# Patient Record
Sex: Male | Born: 1945
Health system: Southern US, Community
[De-identification: ages and names within clinical notes are randomized; demographics above are authoritative.]

## PROBLEM LIST (undated history)

## (undated) DIAGNOSIS — R002 Palpitations: Secondary | ICD-10-CM

## (undated) HISTORY — DX: Palpitations: R00.2

---

## 2004-01-25 ENCOUNTER — Ambulatory Visit (HOSPITAL_COMMUNITY): Admission: RE | Admit: 2004-01-25 | Discharge: 2004-01-25 | Payer: Self-pay | Admitting: *Deleted

## 2015-08-14 DIAGNOSIS — R972 Elevated prostate specific antigen [PSA]: Secondary | ICD-10-CM | POA: Diagnosis not present

## 2015-11-12 DIAGNOSIS — R972 Elevated prostate specific antigen [PSA]: Secondary | ICD-10-CM | POA: Diagnosis not present

## 2015-12-04 DIAGNOSIS — L821 Other seborrheic keratosis: Secondary | ICD-10-CM | POA: Diagnosis not present

## 2015-12-04 DIAGNOSIS — D1801 Hemangioma of skin and subcutaneous tissue: Secondary | ICD-10-CM | POA: Diagnosis not present

## 2015-12-04 DIAGNOSIS — D230 Other benign neoplasm of skin of lip: Secondary | ICD-10-CM | POA: Diagnosis not present

## 2015-12-04 DIAGNOSIS — D044 Carcinoma in situ of skin of scalp and neck: Secondary | ICD-10-CM | POA: Diagnosis not present

## 2015-12-04 DIAGNOSIS — D485 Neoplasm of uncertain behavior of skin: Secondary | ICD-10-CM | POA: Diagnosis not present

## 2015-12-04 DIAGNOSIS — D0421 Carcinoma in situ of skin of right ear and external auricular canal: Secondary | ICD-10-CM | POA: Diagnosis not present

## 2015-12-04 DIAGNOSIS — Z85828 Personal history of other malignant neoplasm of skin: Secondary | ICD-10-CM | POA: Diagnosis not present

## 2015-12-04 DIAGNOSIS — L570 Actinic keratosis: Secondary | ICD-10-CM | POA: Diagnosis not present

## 2015-12-04 DIAGNOSIS — L438 Other lichen planus: Secondary | ICD-10-CM | POA: Diagnosis not present

## 2016-03-14 DIAGNOSIS — H43813 Vitreous degeneration, bilateral: Secondary | ICD-10-CM | POA: Diagnosis not present

## 2016-03-14 DIAGNOSIS — H52223 Regular astigmatism, bilateral: Secondary | ICD-10-CM | POA: Diagnosis not present

## 2016-03-14 DIAGNOSIS — H524 Presbyopia: Secondary | ICD-10-CM | POA: Diagnosis not present

## 2016-03-14 DIAGNOSIS — H25813 Combined forms of age-related cataract, bilateral: Secondary | ICD-10-CM | POA: Diagnosis not present

## 2016-03-14 DIAGNOSIS — H43393 Other vitreous opacities, bilateral: Secondary | ICD-10-CM | POA: Diagnosis not present

## 2016-03-14 DIAGNOSIS — H5203 Hypermetropia, bilateral: Secondary | ICD-10-CM | POA: Diagnosis not present

## 2016-06-04 DIAGNOSIS — L905 Scar conditions and fibrosis of skin: Secondary | ICD-10-CM | POA: Diagnosis not present

## 2016-06-04 DIAGNOSIS — D2272 Melanocytic nevi of left lower limb, including hip: Secondary | ICD-10-CM | POA: Diagnosis not present

## 2016-06-04 DIAGNOSIS — L821 Other seborrheic keratosis: Secondary | ICD-10-CM | POA: Diagnosis not present

## 2016-06-04 DIAGNOSIS — L988 Other specified disorders of the skin and subcutaneous tissue: Secondary | ICD-10-CM | POA: Diagnosis not present

## 2016-06-04 DIAGNOSIS — L820 Inflamed seborrheic keratosis: Secondary | ICD-10-CM | POA: Diagnosis not present

## 2016-06-04 DIAGNOSIS — D1801 Hemangioma of skin and subcutaneous tissue: Secondary | ICD-10-CM | POA: Diagnosis not present

## 2016-06-04 DIAGNOSIS — D485 Neoplasm of uncertain behavior of skin: Secondary | ICD-10-CM | POA: Diagnosis not present

## 2016-06-04 DIAGNOSIS — Z85828 Personal history of other malignant neoplasm of skin: Secondary | ICD-10-CM | POA: Diagnosis not present

## 2016-06-04 DIAGNOSIS — D2271 Melanocytic nevi of right lower limb, including hip: Secondary | ICD-10-CM | POA: Diagnosis not present

## 2016-06-04 DIAGNOSIS — L570 Actinic keratosis: Secondary | ICD-10-CM | POA: Diagnosis not present

## 2016-07-14 DIAGNOSIS — Z79899 Other long term (current) drug therapy: Secondary | ICD-10-CM | POA: Diagnosis not present

## 2016-07-14 DIAGNOSIS — Z0001 Encounter for general adult medical examination with abnormal findings: Secondary | ICD-10-CM | POA: Diagnosis not present

## 2016-07-14 DIAGNOSIS — E7800 Pure hypercholesterolemia, unspecified: Secondary | ICD-10-CM | POA: Diagnosis not present

## 2016-07-14 DIAGNOSIS — E559 Vitamin D deficiency, unspecified: Secondary | ICD-10-CM | POA: Diagnosis not present

## 2016-07-14 DIAGNOSIS — Z23 Encounter for immunization: Secondary | ICD-10-CM | POA: Diagnosis not present

## 2016-07-14 DIAGNOSIS — R972 Elevated prostate specific antigen [PSA]: Secondary | ICD-10-CM | POA: Diagnosis not present

## 2016-12-03 DIAGNOSIS — Z85828 Personal history of other malignant neoplasm of skin: Secondary | ICD-10-CM | POA: Diagnosis not present

## 2016-12-03 DIAGNOSIS — L570 Actinic keratosis: Secondary | ICD-10-CM | POA: Diagnosis not present

## 2016-12-03 DIAGNOSIS — D1801 Hemangioma of skin and subcutaneous tissue: Secondary | ICD-10-CM | POA: Diagnosis not present

## 2016-12-03 DIAGNOSIS — L821 Other seborrheic keratosis: Secondary | ICD-10-CM | POA: Diagnosis not present

## 2017-05-13 DIAGNOSIS — D1801 Hemangioma of skin and subcutaneous tissue: Secondary | ICD-10-CM | POA: Diagnosis not present

## 2017-05-13 DIAGNOSIS — L570 Actinic keratosis: Secondary | ICD-10-CM | POA: Diagnosis not present

## 2017-05-13 DIAGNOSIS — L438 Other lichen planus: Secondary | ICD-10-CM | POA: Diagnosis not present

## 2017-05-13 DIAGNOSIS — L905 Scar conditions and fibrosis of skin: Secondary | ICD-10-CM | POA: Diagnosis not present

## 2017-05-13 DIAGNOSIS — L565 Disseminated superficial actinic porokeratosis (DSAP): Secondary | ICD-10-CM | POA: Diagnosis not present

## 2017-05-13 DIAGNOSIS — Z85828 Personal history of other malignant neoplasm of skin: Secondary | ICD-10-CM | POA: Diagnosis not present

## 2017-05-13 DIAGNOSIS — H61001 Unspecified perichondritis of right external ear: Secondary | ICD-10-CM | POA: Diagnosis not present

## 2017-05-13 DIAGNOSIS — L821 Other seborrheic keratosis: Secondary | ICD-10-CM | POA: Diagnosis not present

## 2017-07-24 DIAGNOSIS — E7800 Pure hypercholesterolemia, unspecified: Secondary | ICD-10-CM | POA: Diagnosis not present

## 2017-07-24 DIAGNOSIS — Z125 Encounter for screening for malignant neoplasm of prostate: Secondary | ICD-10-CM | POA: Diagnosis not present

## 2017-07-24 DIAGNOSIS — Z79899 Other long term (current) drug therapy: Secondary | ICD-10-CM | POA: Diagnosis not present

## 2017-07-24 DIAGNOSIS — Z0000 Encounter for general adult medical examination without abnormal findings: Secondary | ICD-10-CM | POA: Diagnosis not present

## 2017-07-24 DIAGNOSIS — Z23 Encounter for immunization: Secondary | ICD-10-CM | POA: Diagnosis not present

## 2017-07-24 DIAGNOSIS — M545 Low back pain: Secondary | ICD-10-CM | POA: Diagnosis not present

## 2017-07-28 DIAGNOSIS — K648 Other hemorrhoids: Secondary | ICD-10-CM | POA: Diagnosis not present

## 2017-07-28 DIAGNOSIS — Z86010 Personal history of colonic polyps: Secondary | ICD-10-CM | POA: Diagnosis not present

## 2017-07-28 DIAGNOSIS — K5730 Diverticulosis of large intestine without perforation or abscess without bleeding: Secondary | ICD-10-CM | POA: Diagnosis not present

## 2017-07-28 DIAGNOSIS — K621 Rectal polyp: Secondary | ICD-10-CM | POA: Diagnosis not present

## 2017-07-28 DIAGNOSIS — K6289 Other specified diseases of anus and rectum: Secondary | ICD-10-CM | POA: Diagnosis not present

## 2017-07-29 ENCOUNTER — Other Ambulatory Visit: Payer: Self-pay | Admitting: Family Medicine

## 2017-07-29 DIAGNOSIS — M545 Low back pain, unspecified: Secondary | ICD-10-CM

## 2017-07-30 DIAGNOSIS — K621 Rectal polyp: Secondary | ICD-10-CM | POA: Diagnosis not present

## 2017-11-11 DIAGNOSIS — D1722 Benign lipomatous neoplasm of skin and subcutaneous tissue of left arm: Secondary | ICD-10-CM | POA: Diagnosis not present

## 2017-11-11 DIAGNOSIS — D2372 Other benign neoplasm of skin of left lower limb, including hip: Secondary | ICD-10-CM | POA: Diagnosis not present

## 2017-11-11 DIAGNOSIS — L570 Actinic keratosis: Secondary | ICD-10-CM | POA: Diagnosis not present

## 2017-11-11 DIAGNOSIS — L821 Other seborrheic keratosis: Secondary | ICD-10-CM | POA: Diagnosis not present

## 2017-11-11 DIAGNOSIS — L820 Inflamed seborrheic keratosis: Secondary | ICD-10-CM | POA: Diagnosis not present

## 2017-11-11 DIAGNOSIS — Z85828 Personal history of other malignant neoplasm of skin: Secondary | ICD-10-CM | POA: Diagnosis not present

## 2018-05-17 DIAGNOSIS — L570 Actinic keratosis: Secondary | ICD-10-CM | POA: Diagnosis not present

## 2018-05-17 DIAGNOSIS — D045 Carcinoma in situ of skin of trunk: Secondary | ICD-10-CM | POA: Diagnosis not present

## 2018-05-17 DIAGNOSIS — D485 Neoplasm of uncertain behavior of skin: Secondary | ICD-10-CM | POA: Diagnosis not present

## 2018-05-17 DIAGNOSIS — L821 Other seborrheic keratosis: Secondary | ICD-10-CM | POA: Diagnosis not present

## 2018-05-17 DIAGNOSIS — Z85828 Personal history of other malignant neoplasm of skin: Secondary | ICD-10-CM | POA: Diagnosis not present

## 2018-05-17 DIAGNOSIS — L820 Inflamed seborrheic keratosis: Secondary | ICD-10-CM | POA: Diagnosis not present

## 2018-05-17 DIAGNOSIS — D1801 Hemangioma of skin and subcutaneous tissue: Secondary | ICD-10-CM | POA: Diagnosis not present

## 2018-05-17 DIAGNOSIS — D0472 Carcinoma in situ of skin of left lower limb, including hip: Secondary | ICD-10-CM | POA: Diagnosis not present

## 2018-07-30 DIAGNOSIS — E7800 Pure hypercholesterolemia, unspecified: Secondary | ICD-10-CM | POA: Diagnosis not present

## 2018-07-30 DIAGNOSIS — Z23 Encounter for immunization: Secondary | ICD-10-CM | POA: Diagnosis not present

## 2018-07-30 DIAGNOSIS — Z79899 Other long term (current) drug therapy: Secondary | ICD-10-CM | POA: Diagnosis not present

## 2018-07-30 DIAGNOSIS — Z0001 Encounter for general adult medical examination with abnormal findings: Secondary | ICD-10-CM | POA: Diagnosis not present

## 2018-07-30 DIAGNOSIS — Z125 Encounter for screening for malignant neoplasm of prostate: Secondary | ICD-10-CM | POA: Diagnosis not present

## 2019-03-15 DIAGNOSIS — M791 Myalgia, unspecified site: Secondary | ICD-10-CM | POA: Diagnosis not present

## 2019-03-15 DIAGNOSIS — M25519 Pain in unspecified shoulder: Secondary | ICD-10-CM | POA: Diagnosis not present

## 2019-03-15 DIAGNOSIS — R5383 Other fatigue: Secondary | ICD-10-CM | POA: Diagnosis not present

## 2019-03-17 DIAGNOSIS — M25519 Pain in unspecified shoulder: Secondary | ICD-10-CM | POA: Diagnosis not present

## 2019-03-17 DIAGNOSIS — R5383 Other fatigue: Secondary | ICD-10-CM | POA: Diagnosis not present

## 2019-03-30 DIAGNOSIS — R1013 Epigastric pain: Secondary | ICD-10-CM | POA: Diagnosis not present

## 2019-03-30 DIAGNOSIS — K219 Gastro-esophageal reflux disease without esophagitis: Secondary | ICD-10-CM | POA: Diagnosis not present

## 2019-03-30 DIAGNOSIS — R142 Eructation: Secondary | ICD-10-CM | POA: Diagnosis not present

## 2019-05-17 DIAGNOSIS — Z23 Encounter for immunization: Secondary | ICD-10-CM | POA: Diagnosis not present

## 2019-05-19 DIAGNOSIS — Z85828 Personal history of other malignant neoplasm of skin: Secondary | ICD-10-CM | POA: Diagnosis not present

## 2019-05-19 DIAGNOSIS — L565 Disseminated superficial actinic porokeratosis (DSAP): Secondary | ICD-10-CM | POA: Diagnosis not present

## 2019-05-19 DIAGNOSIS — L820 Inflamed seborrheic keratosis: Secondary | ICD-10-CM | POA: Diagnosis not present

## 2019-05-19 DIAGNOSIS — D1801 Hemangioma of skin and subcutaneous tissue: Secondary | ICD-10-CM | POA: Diagnosis not present

## 2019-05-19 DIAGNOSIS — L821 Other seborrheic keratosis: Secondary | ICD-10-CM | POA: Diagnosis not present

## 2019-05-19 DIAGNOSIS — L570 Actinic keratosis: Secondary | ICD-10-CM | POA: Diagnosis not present

## 2019-08-23 DIAGNOSIS — Z0001 Encounter for general adult medical examination with abnormal findings: Secondary | ICD-10-CM | POA: Diagnosis not present

## 2019-08-23 DIAGNOSIS — Z79899 Other long term (current) drug therapy: Secondary | ICD-10-CM | POA: Diagnosis not present

## 2019-08-23 DIAGNOSIS — K219 Gastro-esophageal reflux disease without esophagitis: Secondary | ICD-10-CM | POA: Diagnosis not present

## 2019-08-23 DIAGNOSIS — E7800 Pure hypercholesterolemia, unspecified: Secondary | ICD-10-CM | POA: Diagnosis not present

## 2019-09-24 DIAGNOSIS — Z23 Encounter for immunization: Secondary | ICD-10-CM | POA: Diagnosis not present

## 2019-09-28 DIAGNOSIS — Z125 Encounter for screening for malignant neoplasm of prostate: Secondary | ICD-10-CM | POA: Diagnosis not present

## 2019-09-28 DIAGNOSIS — Z79899 Other long term (current) drug therapy: Secondary | ICD-10-CM | POA: Diagnosis not present

## 2019-09-28 DIAGNOSIS — E7800 Pure hypercholesterolemia, unspecified: Secondary | ICD-10-CM | POA: Diagnosis not present

## 2019-09-28 DIAGNOSIS — Z0001 Encounter for general adult medical examination with abnormal findings: Secondary | ICD-10-CM | POA: Diagnosis not present

## 2019-10-22 DIAGNOSIS — Z23 Encounter for immunization: Secondary | ICD-10-CM | POA: Diagnosis not present

## 2019-10-27 DIAGNOSIS — R7309 Other abnormal glucose: Secondary | ICD-10-CM | POA: Diagnosis not present

## 2019-11-15 DIAGNOSIS — L570 Actinic keratosis: Secondary | ICD-10-CM | POA: Diagnosis not present

## 2019-11-15 DIAGNOSIS — L814 Other melanin hyperpigmentation: Secondary | ICD-10-CM | POA: Diagnosis not present

## 2019-11-15 DIAGNOSIS — L821 Other seborrheic keratosis: Secondary | ICD-10-CM | POA: Diagnosis not present

## 2019-11-15 DIAGNOSIS — Z85828 Personal history of other malignant neoplasm of skin: Secondary | ICD-10-CM | POA: Diagnosis not present

## 2019-11-15 DIAGNOSIS — D1801 Hemangioma of skin and subcutaneous tissue: Secondary | ICD-10-CM | POA: Diagnosis not present

## 2020-03-15 DIAGNOSIS — Z20822 Contact with and (suspected) exposure to covid-19: Secondary | ICD-10-CM | POA: Diagnosis not present

## 2020-03-18 DIAGNOSIS — J309 Allergic rhinitis, unspecified: Secondary | ICD-10-CM | POA: Diagnosis not present

## 2020-03-18 DIAGNOSIS — J029 Acute pharyngitis, unspecified: Secondary | ICD-10-CM | POA: Diagnosis not present

## 2020-05-18 DIAGNOSIS — Z23 Encounter for immunization: Secondary | ICD-10-CM | POA: Diagnosis not present

## 2020-05-21 DIAGNOSIS — L700 Acne vulgaris: Secondary | ICD-10-CM | POA: Diagnosis not present

## 2020-05-21 DIAGNOSIS — L814 Other melanin hyperpigmentation: Secondary | ICD-10-CM | POA: Diagnosis not present

## 2020-05-21 DIAGNOSIS — L821 Other seborrheic keratosis: Secondary | ICD-10-CM | POA: Diagnosis not present

## 2020-05-21 DIAGNOSIS — Z85828 Personal history of other malignant neoplasm of skin: Secondary | ICD-10-CM | POA: Diagnosis not present

## 2020-05-21 DIAGNOSIS — D1801 Hemangioma of skin and subcutaneous tissue: Secondary | ICD-10-CM | POA: Diagnosis not present

## 2020-05-21 DIAGNOSIS — L570 Actinic keratosis: Secondary | ICD-10-CM | POA: Diagnosis not present

## 2020-05-23 ENCOUNTER — Other Ambulatory Visit: Payer: Self-pay

## 2020-05-23 ENCOUNTER — Encounter (HOSPITAL_COMMUNITY): Payer: Self-pay | Admitting: Emergency Medicine

## 2020-05-23 ENCOUNTER — Ambulatory Visit (HOSPITAL_COMMUNITY)
Admission: EM | Admit: 2020-05-23 | Discharge: 2020-05-23 | Disposition: A | Discharge status: Home / Self Care | Payer: Medicare Other | Attending: Family Medicine | Admitting: Family Medicine

## 2020-05-23 DIAGNOSIS — R531 Weakness: Secondary | ICD-10-CM | POA: Diagnosis not present

## 2020-05-23 DIAGNOSIS — R0602 Shortness of breath: Secondary | ICD-10-CM | POA: Insufficient documentation

## 2020-05-23 DIAGNOSIS — M549 Dorsalgia, unspecified: Secondary | ICD-10-CM | POA: Diagnosis not present

## 2020-05-23 DIAGNOSIS — R002 Palpitations: Secondary | ICD-10-CM | POA: Diagnosis not present

## 2020-05-23 LAB — CBC
HCT: 46 % (ref 39.0–52.0)
Hemoglobin: 15.2 g/dL (ref 13.0–17.0)
MCH: 28.7 pg (ref 26.0–34.0)
MCHC: 33 g/dL (ref 30.0–36.0)
MCV: 86.8 fL (ref 80.0–100.0)
Platelets: 220 10*3/uL (ref 150–400)
RBC: 5.3 MIL/uL (ref 4.22–5.81)
RDW: 12.9 % (ref 11.5–15.5)
WBC: 6.4 10*3/uL (ref 4.0–10.5)
nRBC: 0 % (ref 0.0–0.2)

## 2020-05-23 LAB — TSH: TSH: 1.08 u[IU]/mL (ref 0.350–4.500)

## 2020-05-23 LAB — BASIC METABOLIC PANEL
Anion gap: 11 (ref 5–15)
BUN: 14 mg/dL (ref 8–23)
CO2: 22 mmol/L (ref 22–32)
Calcium: 9.7 mg/dL (ref 8.9–10.3)
Chloride: 104 mmol/L (ref 98–111)
Creatinine, Ser: 1.24 mg/dL (ref 0.61–1.24)
GFR, Estimated: 58 mL/min — ABNORMAL LOW (ref >60)
Glucose, Bld: 141 mg/dL — ABNORMAL HIGH (ref 70–99)
Potassium: 3.6 mmol/L (ref 3.5–5.1)
Sodium: 137 mmol/L (ref 135–145)

## 2020-05-23 NOTE — ED Triage Notes (Signed)
Pt c/o rapid heart beat, weakness in legs, mid back pain, and shortness of breath onset yesterday. Pt states that the rapid heart beat is intermittent. He denies hx of asthma or pneumonia. He denies any cardiac hx.

## 2020-05-23 NOTE — Discharge Instructions (Signed)
You have been seen at the [facility] today for palpitations and brief shortness of breath Your evaluation today was not suggestive of any emergent condition requiring medical intervention at this time. Your ECG (heart tracing) did not show any signs of a heart attack. However, some medical problems make take more time to appear. Therefore, it's very important that you pay attention to any new symptoms or worsening of your current condition.  Please proceed directly to the Emergency Department immediately should you feel worse in any way or have any of the following symptoms: increasing or different symptoms, pain that spreads to your arm, neck, jaw, back or abdomen, or nausea and vomiting.

## 2020-05-24 NOTE — ED Provider Notes (Signed)
[place]   [id number] 05/23/20 Arrival Time: 1640  ASSESSMENT & PLAN:  1. Palpitations   2. Shortness of breath     Plans f/u with PCP. May need monitor to wear should symptoms continue. Labs pending.  ECG: Performed today and interpreted by me: sinus rhythm with occasional premature atrial contraction.  Very low susp for PE. Discussed.    Discharge Instructions     You have been seen at the [facility] today for palpitations and brief shortness of breath Your evaluation today was not suggestive of any emergent condition requiring medical intervention at this time. Your ECG (heart tracing) did not show any signs of a heart attack. However, some medical problems make take more time to appear. Therefore, it's very important that you pay attention to any new symptoms or worsening of your current condition.  Please proceed directly to the Emergency Department immediately should you feel worse in any way or have any of the following symptoms: increasing or different symptoms, pain that spreads to your arm, neck, jaw, back or abdomen, or nausea and vomiting.      Reviewed expectations re: course of current medical issues. Questions answered. Outlined signs and symptoms indicating need for more acute intervention. Patient verbalized understanding. After Visit Summary given.   SUBJECTIVE:  History from: patient. Johnny Choi is a 74 y.o. male who presents with complaint of "feeling my heart beat fast". Noted today. Intermittent. Without associated CP//n/v. Questions mild SOB while feeling hear race. No recent illnesses. Symptoms are sporadic without pattern. No new medications. No recent travel/immobility. Illicit drug use: none.  Social History   Tobacco Use  Smoking Status Never Smoker  Smokeless Tobacco Never Used   Social History   Substance and Sexual Activity  Alcohol Use Yes    Increased blood pressure noted today. Reports that he is  not treated for HTN.    OBJECTIVE:  Vitals:   05/23/20 1703  BP: (!) 151/112  Pulse: (!) 112  Resp: 18  Temp: 98 F (36.7 C)  TempSrc: Oral  SpO2: 97%     Tachycardia noted.  General appearance: alert, oriented, no acute distress Eyes: PERRLA; EOMI; conjunctivae normal HENT: normocephalic; atraumatic Neck: supple with FROM Lungs: without labored respirations; speaks full sentences without difficulty; CTAB Heart: regular rate and rhythm without murmer Chest Wall: without tenderness to palpation Abdomen: soft, non-tender; no guarding or rebound tenderness Extremities: without edema; without calf swelling or tenderness; symmetrical without gross deformities Skin: warm and dry; without rash or lesions Neuro: normal gait Psychological: alert and cooperative; normal mood and affect  Labs:  Labs Reviewed  BASIC METABOLIC PANEL  CBC  TSH     Allergies  Allergen Reactions  . Naproxen Sodium Swelling    History reviewed. No pertinent past medical history. Social History   Socioeconomic History  . Marital status: Married    Spouse name: Not on file  . Number of children: Not on file  . Years of education: Not on file  . Highest education level: Not on file  Occupational History  . Not on file  Tobacco Use  . Smoking status: Never Smoker  . Smokeless tobacco: Never Used  Vaping Use  . Vaping Use: Never used  Substance and Sexual Activity  . Alcohol use: Yes  . Drug use: Never  . Sexual activity: Not on file  Other Topics Concern  . Not on file  Social History Narrative  . Not on file   Social  Determinants of Health   Financial Resource Strain:   . Difficulty of Paying Living Expenses: Not on file  Food Insecurity:   . Worried About Charity fundraiser in the Last Year: Not on file  . Ran Out of Food in the Last Year: Not on file  Transportation Needs:   . Lack of Transportation (Medical): Not on file  . Lack of Transportation (Non-Medical): Not on  file  Physical Activity:   . Days of Exercise per Week: Not on file  . Minutes of Exercise per Session: Not on file  Stress:   . Feeling of Stress : Not on file  Social Connections:   . Frequency of Communication with Friends and Family: Not on file  . Frequency of Social Gatherings with Friends and Family: Not on file  . Attends Religious Services: Not on file  . Active Member of Clubs or Organizations: Not on file  . Attends Archivist Meetings: Not on file  . Marital Status: Not on file  Intimate Partner Violence:   . Fear of Current or Ex-Partner: Not on file  . Emotionally Abused: Not on file  . Physically Abused: Not on file  . Sexually Abused: Not on file   Family History  Problem Relation Age of Onset  . Cancer Mother   . Stroke Father    History reviewed. No pertinent surgical history.   Vanessa Kick, MD 05/24/20 1010

## 2020-05-28 DIAGNOSIS — I491 Atrial premature depolarization: Secondary | ICD-10-CM | POA: Diagnosis not present

## 2020-05-28 DIAGNOSIS — R002 Palpitations: Secondary | ICD-10-CM | POA: Diagnosis not present

## 2020-06-06 DIAGNOSIS — Z23 Encounter for immunization: Secondary | ICD-10-CM | POA: Diagnosis not present

## 2020-08-24 DIAGNOSIS — E7800 Pure hypercholesterolemia, unspecified: Secondary | ICD-10-CM | POA: Diagnosis not present

## 2020-08-24 DIAGNOSIS — Z125 Encounter for screening for malignant neoplasm of prostate: Secondary | ICD-10-CM | POA: Diagnosis not present

## 2020-08-24 DIAGNOSIS — Z79899 Other long term (current) drug therapy: Secondary | ICD-10-CM | POA: Diagnosis not present

## 2020-08-24 DIAGNOSIS — K219 Gastro-esophageal reflux disease without esophagitis: Secondary | ICD-10-CM | POA: Diagnosis not present

## 2020-08-24 DIAGNOSIS — I491 Atrial premature depolarization: Secondary | ICD-10-CM | POA: Diagnosis not present

## 2020-08-24 DIAGNOSIS — Z0001 Encounter for general adult medical examination with abnormal findings: Secondary | ICD-10-CM | POA: Diagnosis not present

## 2020-09-12 ENCOUNTER — Ambulatory Visit (INDEPENDENT_AMBULATORY_CARE_PROVIDER_SITE_OTHER): Payer: Medicare Other | Admitting: Internal Medicine

## 2020-09-12 ENCOUNTER — Other Ambulatory Visit: Payer: Self-pay

## 2020-09-12 ENCOUNTER — Encounter: Payer: Self-pay | Admitting: Internal Medicine

## 2020-09-12 VITALS — BP 140/90 | HR 95 | Ht 72.0 in | Wt 207.0 lb

## 2020-09-12 DIAGNOSIS — R002 Palpitations: Secondary | ICD-10-CM

## 2020-09-12 DIAGNOSIS — I491 Atrial premature depolarization: Secondary | ICD-10-CM | POA: Diagnosis not present

## 2020-09-12 NOTE — Patient Instructions (Signed)
Medication Instructions:  OK to stop aspirin Continue other current medications  *If you need a refill on your cardiac medications before your next appointment, please call your pharmacy*  Follow-Up: At [facility], you and your health needs are our priority.  As part of our continuing mission to provide you with exceptional heart care, we have created designated Provider Care Teams.  These Care Teams include your primary Cardiologist (physician) and Advanced Practice Providers (APPs -  Physician Assistants and Nurse Practitioners) who all work together to provide you with the care you need, when you need it.  We recommend signing up for the patient portal called "MyChart".  Sign up information is provided on this After Visit Summary.  MyChart is used to connect with patients for Virtual Visits (Telemedicine).  Patients are able to view lab/test results, encounter notes, upcoming appointments, etc.  Non-urgent messages can be sent to your provider as well.   To learn more about what you can do with MyChart, go to [email or web address].    Your next appointment:   AS NEEDED with Dr. Debara Pickett  Other Instructions

## 2020-09-12 NOTE — Progress Notes (Signed)
OFFICE CONSULT NOTE  Chief Complaint:  Palpitations  Primary Care Physician: Johnny Choi  HPI:  Johnny Choi is a 75 y.o. male who is being seen today for the evaluation of palpitations at the request of Koirala, Dibas, MD.  This is a pleasant 75 year old male kindly referred for palpitations.  In October he was doing a lot of work outside and had racing of his heart and palpitations.  He presented to the emergency department.  Blood pressure was also elevated but is not known to be elevated.  He denied any chest pain.  He ruled out for MI.  An EKG showed some PACs but otherwise no arrhythmia.  He did follow-up with his PCP and was recommended to see cardiology.  He has no known history of cardiovascular disease.  He denies any diabetes, hypertension, dyslipidemia or other associated symptoms.  His father did have some cardiovascular disease but was a heavy smoker.  He is retired from [employer].  He takes no current medications other than a daily low-dose aspirin.  It was questionable whether he needs to take this or not.  He denies any more palpitations since this episode.  He exercises regularly and has not had any issues.  PMHx:  Past Medical History:  Diagnosis Date  . Palpitations     No past surgical history on file.  FAMHx:  Family History  Problem Relation Age of Onset  . Cancer Mother   . Stroke Father     SOCHx:   reports that he has never smoked. He has never used smokeless tobacco. He reports current alcohol use. He reports that he does not use drugs.  ALLERGIES:  Allergies  Allergen Reactions  . Naproxen Sodium Swelling    ROS: Pertinent items noted in HPI and remainder of comprehensive ROS otherwise negative.  HOME MEDS: Current Outpatient Medications on File Prior to Visit  Medication Sig Dispense Refill  . aspirin 81 MG chewable tablet Chew 81 mg by mouth daily.    . Cholecalciferol (VITAMIN D) 50 MCG (2000 UT) tablet 1 tablet    . loratadine  (CLARITIN) 10 MG tablet Take 10 mg by mouth daily.     No current facility-administered medications on file prior to visit.    LABS/IMAGING: No results found for this or any previous visit (from the past 48 hour(s)). No results found.  LIPID PANEL: No results found for: CHOL, TRIG, HDL, CHOLHDL, VLDL, LDLCALC, LDLDIRECT  WEIGHTS: Wt Readings from Last 3 Encounters:  09/12/20 207 lb (93.9 kg)    VITALS: BP 140/90   Pulse 95   Ht 6' (1.829 m)   Wt 207 lb (93.9 kg)   SpO2 99%   BMI 28.07 kg/m   EXAM: General appearance: alert and no distress Neck: no carotid bruit, no JVD and thyroid not enlarged, symmetric, no tenderness/mass/nodules Lungs: clear to auscultation bilaterally Heart: regular rate and rhythm Abdomen: soft, non-tender; bowel sounds normal; no masses,  no organomegaly Extremities: extremities normal, atraumatic, no cyanosis or edema Pulses: 2+ and symmetric Skin: Skin color, texture, turgor normal. No rashes or lesions Neurologic: Grossly normal Psych: Pleasant  EKG: Normal sinus rhythm at 95- personally reviewed  ASSESSMENT: 1. Palpitations/PACs  PLAN: 1.   Johnny Choi had had palpitations/PACs after doing some strenuous work and was noted to have an elevated blood pressure.  It is typically not elevated.  Blood pressure was a little elevated here today however he is not on therapy for this.  He says  that other times his blood pressure has been well controlled.  He currently takes low-dose aspirin however I think there is little indication for this as he has no other risk factors and no known coronary disease.  I advised him to discontinue it.  Since he has had no further palpitations it is difficult to recommend monitoring or other work-up.  He is active and exercises and is asymptomatic with that.  I would continue this and if he has recurrent symptoms he could consider a cardio mobile device or contact our office for monitoring if they recur.  I am happy to see  him back on an as-needed basis.  Thanks again for the kind referral  Johnny Casino, MD, FACC, [place] Director of the [facility]  [facility] Diplomate of the American Board of Clinical Lipidology Attending Cardiologist  Direct Dial: [phone]  Fax: [phone]  Website:  [name] 09/12/2020, 10:20 AM

## 2020-11-19 DIAGNOSIS — L821 Other seborrheic keratosis: Secondary | ICD-10-CM | POA: Diagnosis not present

## 2020-11-19 DIAGNOSIS — L281 Prurigo nodularis: Secondary | ICD-10-CM | POA: Diagnosis not present

## 2020-11-19 DIAGNOSIS — Z85828 Personal history of other malignant neoplasm of skin: Secondary | ICD-10-CM | POA: Diagnosis not present

## 2020-11-19 DIAGNOSIS — D045 Carcinoma in situ of skin of trunk: Secondary | ICD-10-CM | POA: Diagnosis not present

## 2020-11-19 DIAGNOSIS — D485 Neoplasm of uncertain behavior of skin: Secondary | ICD-10-CM | POA: Diagnosis not present

## 2020-11-19 DIAGNOSIS — L570 Actinic keratosis: Secondary | ICD-10-CM | POA: Diagnosis not present

## 2021-04-06 DIAGNOSIS — Z20822 Contact with and (suspected) exposure to covid-19: Secondary | ICD-10-CM | POA: Diagnosis not present

## 2021-05-27 DIAGNOSIS — Z23 Encounter for immunization: Secondary | ICD-10-CM | POA: Diagnosis not present

## 2021-05-30 DIAGNOSIS — L821 Other seborrheic keratosis: Secondary | ICD-10-CM | POA: Diagnosis not present

## 2021-05-30 DIAGNOSIS — D1801 Hemangioma of skin and subcutaneous tissue: Secondary | ICD-10-CM | POA: Diagnosis not present

## 2021-05-30 DIAGNOSIS — Z85828 Personal history of other malignant neoplasm of skin: Secondary | ICD-10-CM | POA: Diagnosis not present

## 2021-05-30 DIAGNOSIS — L820 Inflamed seborrheic keratosis: Secondary | ICD-10-CM | POA: Diagnosis not present

## 2021-05-30 DIAGNOSIS — L570 Actinic keratosis: Secondary | ICD-10-CM | POA: Diagnosis not present

## 2021-06-06 DIAGNOSIS — Z23 Encounter for immunization: Secondary | ICD-10-CM | POA: Diagnosis not present

## 2021-09-04 ENCOUNTER — Other Ambulatory Visit: Payer: Self-pay | Admitting: Family Medicine

## 2021-09-04 ENCOUNTER — Ambulatory Visit
Admission: RE | Admit: 2021-09-04 | Discharge: 2021-09-04 | Disposition: A | Discharge status: Home / Self Care | Payer: Medicare Other | Source: Ambulatory Visit | Attending: Family Medicine | Admitting: Family Medicine

## 2021-09-04 DIAGNOSIS — R7309 Other abnormal glucose: Secondary | ICD-10-CM | POA: Diagnosis not present

## 2021-09-04 DIAGNOSIS — Z0001 Encounter for general adult medical examination with abnormal findings: Secondary | ICD-10-CM | POA: Diagnosis not present

## 2021-09-04 DIAGNOSIS — M25552 Pain in left hip: Secondary | ICD-10-CM

## 2021-09-04 DIAGNOSIS — J309 Allergic rhinitis, unspecified: Secondary | ICD-10-CM | POA: Diagnosis not present

## 2021-09-04 DIAGNOSIS — I491 Atrial premature depolarization: Secondary | ICD-10-CM | POA: Diagnosis not present

## 2021-09-04 DIAGNOSIS — E7800 Pure hypercholesterolemia, unspecified: Secondary | ICD-10-CM | POA: Diagnosis not present

## 2021-09-04 DIAGNOSIS — R7301 Impaired fasting glucose: Secondary | ICD-10-CM | POA: Diagnosis not present

## 2021-09-04 DIAGNOSIS — K219 Gastro-esophageal reflux disease without esophagitis: Secondary | ICD-10-CM | POA: Diagnosis not present

## 2021-09-04 DIAGNOSIS — Z79899 Other long term (current) drug therapy: Secondary | ICD-10-CM | POA: Diagnosis not present

## 2021-10-14 DIAGNOSIS — M542 Cervicalgia: Secondary | ICD-10-CM | POA: Diagnosis not present

## 2021-10-14 DIAGNOSIS — M5451 Vertebrogenic low back pain: Secondary | ICD-10-CM | POA: Diagnosis not present

## 2021-10-14 DIAGNOSIS — M25552 Pain in left hip: Secondary | ICD-10-CM | POA: Diagnosis not present

## 2021-10-21 DIAGNOSIS — M5451 Vertebrogenic low back pain: Secondary | ICD-10-CM | POA: Diagnosis not present

## 2021-10-21 DIAGNOSIS — M542 Cervicalgia: Secondary | ICD-10-CM | POA: Diagnosis not present

## 2021-10-21 DIAGNOSIS — M25552 Pain in left hip: Secondary | ICD-10-CM | POA: Diagnosis not present

## 2021-10-23 DIAGNOSIS — M25552 Pain in left hip: Secondary | ICD-10-CM | POA: Diagnosis not present

## 2021-10-23 DIAGNOSIS — M542 Cervicalgia: Secondary | ICD-10-CM | POA: Diagnosis not present

## 2021-10-23 DIAGNOSIS — M5451 Vertebrogenic low back pain: Secondary | ICD-10-CM | POA: Diagnosis not present

## 2021-10-28 DIAGNOSIS — M25552 Pain in left hip: Secondary | ICD-10-CM | POA: Diagnosis not present

## 2021-10-28 DIAGNOSIS — M542 Cervicalgia: Secondary | ICD-10-CM | POA: Diagnosis not present

## 2021-10-28 DIAGNOSIS — M5451 Vertebrogenic low back pain: Secondary | ICD-10-CM | POA: Diagnosis not present

## 2021-10-30 DIAGNOSIS — M25552 Pain in left hip: Secondary | ICD-10-CM | POA: Diagnosis not present

## 2021-10-30 DIAGNOSIS — M5451 Vertebrogenic low back pain: Secondary | ICD-10-CM | POA: Diagnosis not present

## 2021-10-30 DIAGNOSIS — M542 Cervicalgia: Secondary | ICD-10-CM | POA: Diagnosis not present

## 2021-11-04 DIAGNOSIS — M542 Cervicalgia: Secondary | ICD-10-CM | POA: Diagnosis not present

## 2021-11-04 DIAGNOSIS — M25552 Pain in left hip: Secondary | ICD-10-CM | POA: Diagnosis not present

## 2021-11-04 DIAGNOSIS — M5451 Vertebrogenic low back pain: Secondary | ICD-10-CM | POA: Diagnosis not present

## 2021-11-06 DIAGNOSIS — M5451 Vertebrogenic low back pain: Secondary | ICD-10-CM | POA: Diagnosis not present

## 2021-11-06 DIAGNOSIS — M25552 Pain in left hip: Secondary | ICD-10-CM | POA: Diagnosis not present

## 2021-11-06 DIAGNOSIS — M542 Cervicalgia: Secondary | ICD-10-CM | POA: Diagnosis not present

## 2021-11-11 DIAGNOSIS — M542 Cervicalgia: Secondary | ICD-10-CM | POA: Diagnosis not present

## 2021-11-11 DIAGNOSIS — M25552 Pain in left hip: Secondary | ICD-10-CM | POA: Diagnosis not present

## 2021-11-11 DIAGNOSIS — M5451 Vertebrogenic low back pain: Secondary | ICD-10-CM | POA: Diagnosis not present

## 2021-11-13 DIAGNOSIS — M25552 Pain in left hip: Secondary | ICD-10-CM | POA: Diagnosis not present

## 2021-11-13 DIAGNOSIS — M5451 Vertebrogenic low back pain: Secondary | ICD-10-CM | POA: Diagnosis not present

## 2021-11-13 DIAGNOSIS — M542 Cervicalgia: Secondary | ICD-10-CM | POA: Diagnosis not present

## 2021-11-21 DIAGNOSIS — M25552 Pain in left hip: Secondary | ICD-10-CM | POA: Diagnosis not present

## 2021-11-21 DIAGNOSIS — M5451 Vertebrogenic low back pain: Secondary | ICD-10-CM | POA: Diagnosis not present

## 2021-11-21 DIAGNOSIS — M542 Cervicalgia: Secondary | ICD-10-CM | POA: Diagnosis not present

## 2021-11-27 DIAGNOSIS — D1801 Hemangioma of skin and subcutaneous tissue: Secondary | ICD-10-CM | POA: Diagnosis not present

## 2021-11-27 DIAGNOSIS — L570 Actinic keratosis: Secondary | ICD-10-CM | POA: Diagnosis not present

## 2021-11-27 DIAGNOSIS — Z85828 Personal history of other malignant neoplasm of skin: Secondary | ICD-10-CM | POA: Diagnosis not present

## 2021-11-27 DIAGNOSIS — L820 Inflamed seborrheic keratosis: Secondary | ICD-10-CM | POA: Diagnosis not present

## 2021-11-27 DIAGNOSIS — L821 Other seborrheic keratosis: Secondary | ICD-10-CM | POA: Diagnosis not present

## 2022-04-17 DIAGNOSIS — M25552 Pain in left hip: Secondary | ICD-10-CM | POA: Diagnosis not present

## 2022-04-29 DIAGNOSIS — M545 Low back pain, unspecified: Secondary | ICD-10-CM | POA: Diagnosis not present

## 2022-04-29 DIAGNOSIS — M7062 Trochanteric bursitis, left hip: Secondary | ICD-10-CM | POA: Diagnosis not present

## 2022-05-19 DIAGNOSIS — M545 Low back pain, unspecified: Secondary | ICD-10-CM | POA: Diagnosis not present

## 2022-06-09 DIAGNOSIS — D225 Melanocytic nevi of trunk: Secondary | ICD-10-CM | POA: Diagnosis not present

## 2022-06-09 DIAGNOSIS — L570 Actinic keratosis: Secondary | ICD-10-CM | POA: Diagnosis not present

## 2022-06-09 DIAGNOSIS — Z85828 Personal history of other malignant neoplasm of skin: Secondary | ICD-10-CM | POA: Diagnosis not present

## 2022-06-09 DIAGNOSIS — L821 Other seborrheic keratosis: Secondary | ICD-10-CM | POA: Diagnosis not present

## 2022-06-09 DIAGNOSIS — D1801 Hemangioma of skin and subcutaneous tissue: Secondary | ICD-10-CM | POA: Diagnosis not present

## 2022-06-09 DIAGNOSIS — Z23 Encounter for immunization: Secondary | ICD-10-CM | POA: Diagnosis not present

## 2022-06-09 DIAGNOSIS — L814 Other melanin hyperpigmentation: Secondary | ICD-10-CM | POA: Diagnosis not present

## 2022-06-09 DIAGNOSIS — L718 Other rosacea: Secondary | ICD-10-CM | POA: Diagnosis not present

## 2022-09-12 DIAGNOSIS — M5432 Sciatica, left side: Secondary | ICD-10-CM | POA: Diagnosis not present

## 2022-09-12 DIAGNOSIS — R7309 Other abnormal glucose: Secondary | ICD-10-CM | POA: Diagnosis not present

## 2022-09-12 DIAGNOSIS — Z79899 Other long term (current) drug therapy: Secondary | ICD-10-CM | POA: Diagnosis not present

## 2022-09-12 DIAGNOSIS — R7301 Impaired fasting glucose: Secondary | ICD-10-CM | POA: Diagnosis not present

## 2022-09-12 DIAGNOSIS — Z0001 Encounter for general adult medical examination with abnormal findings: Secondary | ICD-10-CM | POA: Diagnosis not present

## 2022-09-12 DIAGNOSIS — E7800 Pure hypercholesterolemia, unspecified: Secondary | ICD-10-CM | POA: Diagnosis not present

## 2022-09-12 DIAGNOSIS — K219 Gastro-esophageal reflux disease without esophagitis: Secondary | ICD-10-CM | POA: Diagnosis not present

## 2022-12-10 DIAGNOSIS — L570 Actinic keratosis: Secondary | ICD-10-CM | POA: Diagnosis not present

## 2022-12-10 DIAGNOSIS — C44519 Basal cell carcinoma of skin of other part of trunk: Secondary | ICD-10-CM | POA: Diagnosis not present

## 2022-12-10 DIAGNOSIS — Z85828 Personal history of other malignant neoplasm of skin: Secondary | ICD-10-CM | POA: Diagnosis not present

## 2022-12-10 DIAGNOSIS — L821 Other seborrheic keratosis: Secondary | ICD-10-CM | POA: Diagnosis not present

## 2022-12-10 DIAGNOSIS — L820 Inflamed seborrheic keratosis: Secondary | ICD-10-CM | POA: Diagnosis not present

## 2023-03-16 ENCOUNTER — Other Ambulatory Visit: Payer: Self-pay

## 2023-03-16 ENCOUNTER — Emergency Department (HOSPITAL_BASED_OUTPATIENT_CLINIC_OR_DEPARTMENT_OTHER): Payer: Medicare Other | Admitting: Radiology

## 2023-03-16 ENCOUNTER — Encounter (HOSPITAL_BASED_OUTPATIENT_CLINIC_OR_DEPARTMENT_OTHER): Payer: Self-pay

## 2023-03-16 ENCOUNTER — Emergency Department (HOSPITAL_BASED_OUTPATIENT_CLINIC_OR_DEPARTMENT_OTHER)
Admission: EM | Admit: 2023-03-16 | Discharge: 2023-03-16 | Disposition: A | Discharge status: Home / Self Care | Payer: Medicare Other | Attending: Emergency Medicine | Admitting: Emergency Medicine

## 2023-03-16 DIAGNOSIS — Z1152 Encounter for screening for COVID-19: Secondary | ICD-10-CM | POA: Diagnosis not present

## 2023-03-16 DIAGNOSIS — R9389 Abnormal findings on diagnostic imaging of other specified body structures: Secondary | ICD-10-CM | POA: Diagnosis not present

## 2023-03-16 DIAGNOSIS — Z7982 Long term (current) use of aspirin: Secondary | ICD-10-CM | POA: Diagnosis not present

## 2023-03-16 DIAGNOSIS — R531 Weakness: Secondary | ICD-10-CM | POA: Diagnosis not present

## 2023-03-16 DIAGNOSIS — I700 Atherosclerosis of aorta: Secondary | ICD-10-CM | POA: Diagnosis not present

## 2023-03-16 LAB — URINALYSIS, ROUTINE W REFLEX MICROSCOPIC
Bacteria, UA: NONE SEEN
Bilirubin Urine: NEGATIVE
Glucose, UA: NEGATIVE mg/dL
Hgb urine dipstick: NEGATIVE
Ketones, ur: NEGATIVE mg/dL
Leukocytes,Ua: NEGATIVE
Nitrite: NEGATIVE
Protein, ur: NEGATIVE mg/dL
Specific Gravity, Urine: 1.005 (ref 1.005–1.030)
pH: 7 (ref 5.0–8.0)

## 2023-03-16 LAB — COMPREHENSIVE METABOLIC PANEL
ALT: 20 U/L (ref 0–44)
AST: 15 U/L (ref 15–41)
Albumin: 4.2 g/dL (ref 3.5–5.0)
Alkaline Phosphatase: 65 U/L (ref 38–126)
Anion gap: 9 (ref 5–15)
BUN: 16 mg/dL (ref 8–23)
CO2: 27 mmol/L (ref 22–32)
Calcium: 9.2 mg/dL (ref 8.9–10.3)
Chloride: 102 mmol/L (ref 98–111)
Creatinine, Ser: 1.07 mg/dL (ref 0.61–1.24)
GFR, Estimated: >60 mL/min (ref >60)
Glucose, Bld: 129 mg/dL — ABNORMAL HIGH (ref 70–99)
Potassium: 3.8 mmol/L (ref 3.5–5.1)
Sodium: 138 mmol/L (ref 135–145)
Total Bilirubin: 0.7 mg/dL (ref 0.3–1.2)
Total Protein: 6.2 g/dL — ABNORMAL LOW (ref 6.5–8.1)

## 2023-03-16 LAB — CBC
HCT: 43.7 % (ref 39.0–52.0)
Hemoglobin: 14.9 g/dL (ref 13.0–17.0)
MCH: 29.4 pg (ref 26.0–34.0)
MCHC: 34.1 g/dL (ref 30.0–36.0)
MCV: 86.4 fL (ref 80.0–100.0)
Platelets: 207 10*3/uL (ref 150–400)
RBC: 5.06 MIL/uL (ref 4.22–5.81)
RDW: 12.7 % (ref 11.5–15.5)
WBC: 6 10*3/uL (ref 4.0–10.5)
nRBC: 0 % (ref 0.0–0.2)

## 2023-03-16 LAB — T4, FREE: Free T4: 0.97 ng/dL (ref 0.61–1.12)

## 2023-03-16 LAB — TROPONIN I (HIGH SENSITIVITY)
Troponin I (High Sensitivity): 3 ng/L (ref <18)
Troponin I (High Sensitivity): 3 ng/L (ref <18)

## 2023-03-16 LAB — SARS CORONAVIRUS 2 BY RT PCR: SARS Coronavirus 2 by RT PCR: NEGATIVE

## 2023-03-16 LAB — CBG MONITORING, ED: Glucose-Capillary: 119 mg/dL — ABNORMAL HIGH (ref 70–99)

## 2023-03-16 LAB — TSH: TSH: 1.814 u[IU]/mL (ref 0.350–4.500)

## 2023-03-16 MED ORDER — SODIUM CHLORIDE 0.9 % IV BOLUS
500.0000 mL | Freq: Once | INTRAVENOUS | Status: AC
  Administered 2023-03-16: 500 mL via INTRAVENOUS

## 2023-03-16 NOTE — ED Triage Notes (Signed)
Pt to ED c/o weakness with exertion x 1 week, progressively getting worse. Reports took his bp today at home and was 170/90, denies hx of hypertension. Denies chest pain. A&O X 4 in triage.

## 2023-03-16 NOTE — Discharge Instructions (Signed)
Please follow-up with your primary care doctor, return to the ER if you feel like your symptoms are worsening.  Today your blood pressure was mildly elevated, please check your blood pressure on a daily basis, and then follow-up with your primary care doctor.  You may need to be started on blood pressure medication eventually.  Your labs today are reassuring, it is possible that you may have had a weak episode secondary to dehydration, versus low blood sugar.  Make sure you are drinking plenty, and eating throughout the day

## 2023-03-16 NOTE — ED Notes (Signed)
Pt discharged home and given discharge paperwork. Opportunities given for questions. Pt verbalizes understanding. PIV removed x1. Camela Wich R , RN 

## 2023-03-16 NOTE — ED Provider Notes (Signed)
[place] EMERGENCY DEPARTMENT AT [facility] Provider Note   CSN: [id number] Arrival date & time: 03/16/23  4259     History {Add pertinent medical, surgical, social history, OB history to HPI:1} Chief Complaint  Patient presents with  . Weakness    Johnny Choi is a 77 y.o. male, history of GERD, seasonal allergies, who presents to the ED secondary to increased fatigue this last week.  He stated he had 1 episode in the garden this week, when he felt like he could not work as long as he usually does.  Denies any chest pain, shortness of breath, vomiting, abdominal pain, dizziness.  States then today he woke up and he just felt very weak and like his legs were shaking, he took his blood pressure and it was elevated which never is, and decided to come to the ER.  This weakness episode last about 10 to 15 minutes, and he did not have any chest pain, shortness of breath, abdominal pain nausea, or vomiting with this.  Just felt a little lightheaded, and weak.  Denies any difficulty with speech, weakness of one extremity, or facial droop.     Home Medications Prior to Admission medications   Medication Sig Start Date End Date Taking? Authorizing Provider  aspirin 81 MG chewable tablet Chew 81 mg by mouth daily.    [provider]  Cholecalciferol (VITAMIN D) 50 MCG (2000 UT) tablet 1 tablet    [provider]  loratadine (CLARITIN) 10 MG tablet Take 10 mg by mouth daily.    [provider]      Allergies    Naproxen sodium    Review of Systems   Review of Systems  Constitutional:  Negative for fever.  Respiratory:  Negative for cough and shortness of breath.   Neurological:  Positive for weakness.    Physical Exam Updated Vital Signs BP (!) 180/100 (BP Location: Right Arm)   Pulse 83   Temp (!) 97.4 F (36.3 C) (Oral)   Resp 18   Ht 6' (1.829 m)   Wt 95.3 kg   SpO2 98%   BMI 28.48 kg/m  Physical Exam Vitals and nursing note  reviewed.  Constitutional:      General: He is not in acute distress.    Appearance: He is well-developed.  HENT:     Head: Normocephalic and atraumatic.  Eyes:     Conjunctiva/sclera: Conjunctivae normal.  Cardiovascular:     Rate and Rhythm: Normal rate and regular rhythm.     Heart sounds: No murmur heard. Pulmonary:     Effort: Pulmonary effort is normal. No respiratory distress.     Breath sounds: Normal breath sounds.  Abdominal:     Palpations: Abdomen is soft.     Tenderness: There is no abdominal tenderness.  Musculoskeletal:        General: No swelling.     Cervical back: Neck supple.  Skin:    General: Skin is warm and dry.     Capillary Refill: Capillary refill takes less than 2 seconds.  Neurological:     General: No focal deficit present.     Mental Status: He is alert and oriented to person, place, and time.     Motor: No weakness.  Psychiatric:        Mood and Affect: Mood normal.    ED Results / Procedures / Treatments   Labs (all labs ordered are listed, but only abnormal results are displayed) Labs Reviewed  CBG MONITORING, ED - Abnormal; Notable for the following components:      Result Value   Glucose-Capillary 119 (*)    All other components within normal limits  CBC  URINALYSIS, ROUTINE W REFLEX MICROSCOPIC  COMPREHENSIVE METABOLIC PANEL  TROPONIN I (HIGH SENSITIVITY)    EKG EKG Interpretation Date/Time:  Monday March 16 2023 09:33:28 EDT Ventricular Rate:  84 PR Interval:  173 QRS Duration:  103 QT Interval:  404 QTC Calculation: 478 R Axis:   44  Text Interpretation: Sinus rhythm Borderline T abnormalities, anterior leads Borderline prolonged QT interval Confirmed by Alvino Blood ([id number]) on 03/16/2023 9:39:35 AM  Radiology No results found.  Procedures Procedures  {Document cardiac monitor, telemetry assessment procedure when appropriate:1}  Medications Ordered in ED Medications - No data to display  ED Course/ Medical  Decision Making/ A&P   {   Click here for ABCD2, HEART and other calculatorsREFRESH Note before signing :1}                              Medical Decision Making Amount and/or Complexity of Data Reviewed Labs: ordered. Radiology: ordered.    Final Clinical Impression(s) / ED Diagnoses Final diagnoses:  None    Rx / DC Orders ED Discharge Orders     None

## 2023-05-12 DIAGNOSIS — Z23 Encounter for immunization: Secondary | ICD-10-CM | POA: Diagnosis not present

## 2023-06-15 DIAGNOSIS — L821 Other seborrheic keratosis: Secondary | ICD-10-CM | POA: Diagnosis not present

## 2023-06-15 DIAGNOSIS — D225 Melanocytic nevi of trunk: Secondary | ICD-10-CM | POA: Diagnosis not present

## 2023-06-15 DIAGNOSIS — C44519 Basal cell carcinoma of skin of other part of trunk: Secondary | ICD-10-CM | POA: Diagnosis not present

## 2023-06-15 DIAGNOSIS — C44612 Basal cell carcinoma of skin of right upper limb, including shoulder: Secondary | ICD-10-CM | POA: Diagnosis not present

## 2023-06-15 DIAGNOSIS — Z85828 Personal history of other malignant neoplasm of skin: Secondary | ICD-10-CM | POA: Diagnosis not present

## 2023-06-15 DIAGNOSIS — L570 Actinic keratosis: Secondary | ICD-10-CM | POA: Diagnosis not present

## 2023-06-15 DIAGNOSIS — L820 Inflamed seborrheic keratosis: Secondary | ICD-10-CM | POA: Diagnosis not present

## 2023-08-14 IMAGING — CR DG HIP (WITH OR WITHOUT PELVIS) 2-3V*L*
2 series · 2 of 2 positions shown · non-contrast
Comparison: None.

CLINICAL DATA: Left hip pain.  No injury.

EXAM:
DG HIP (WITH OR WITHOUT PELVIS) 2-3V LEFT

[t hip ap left]
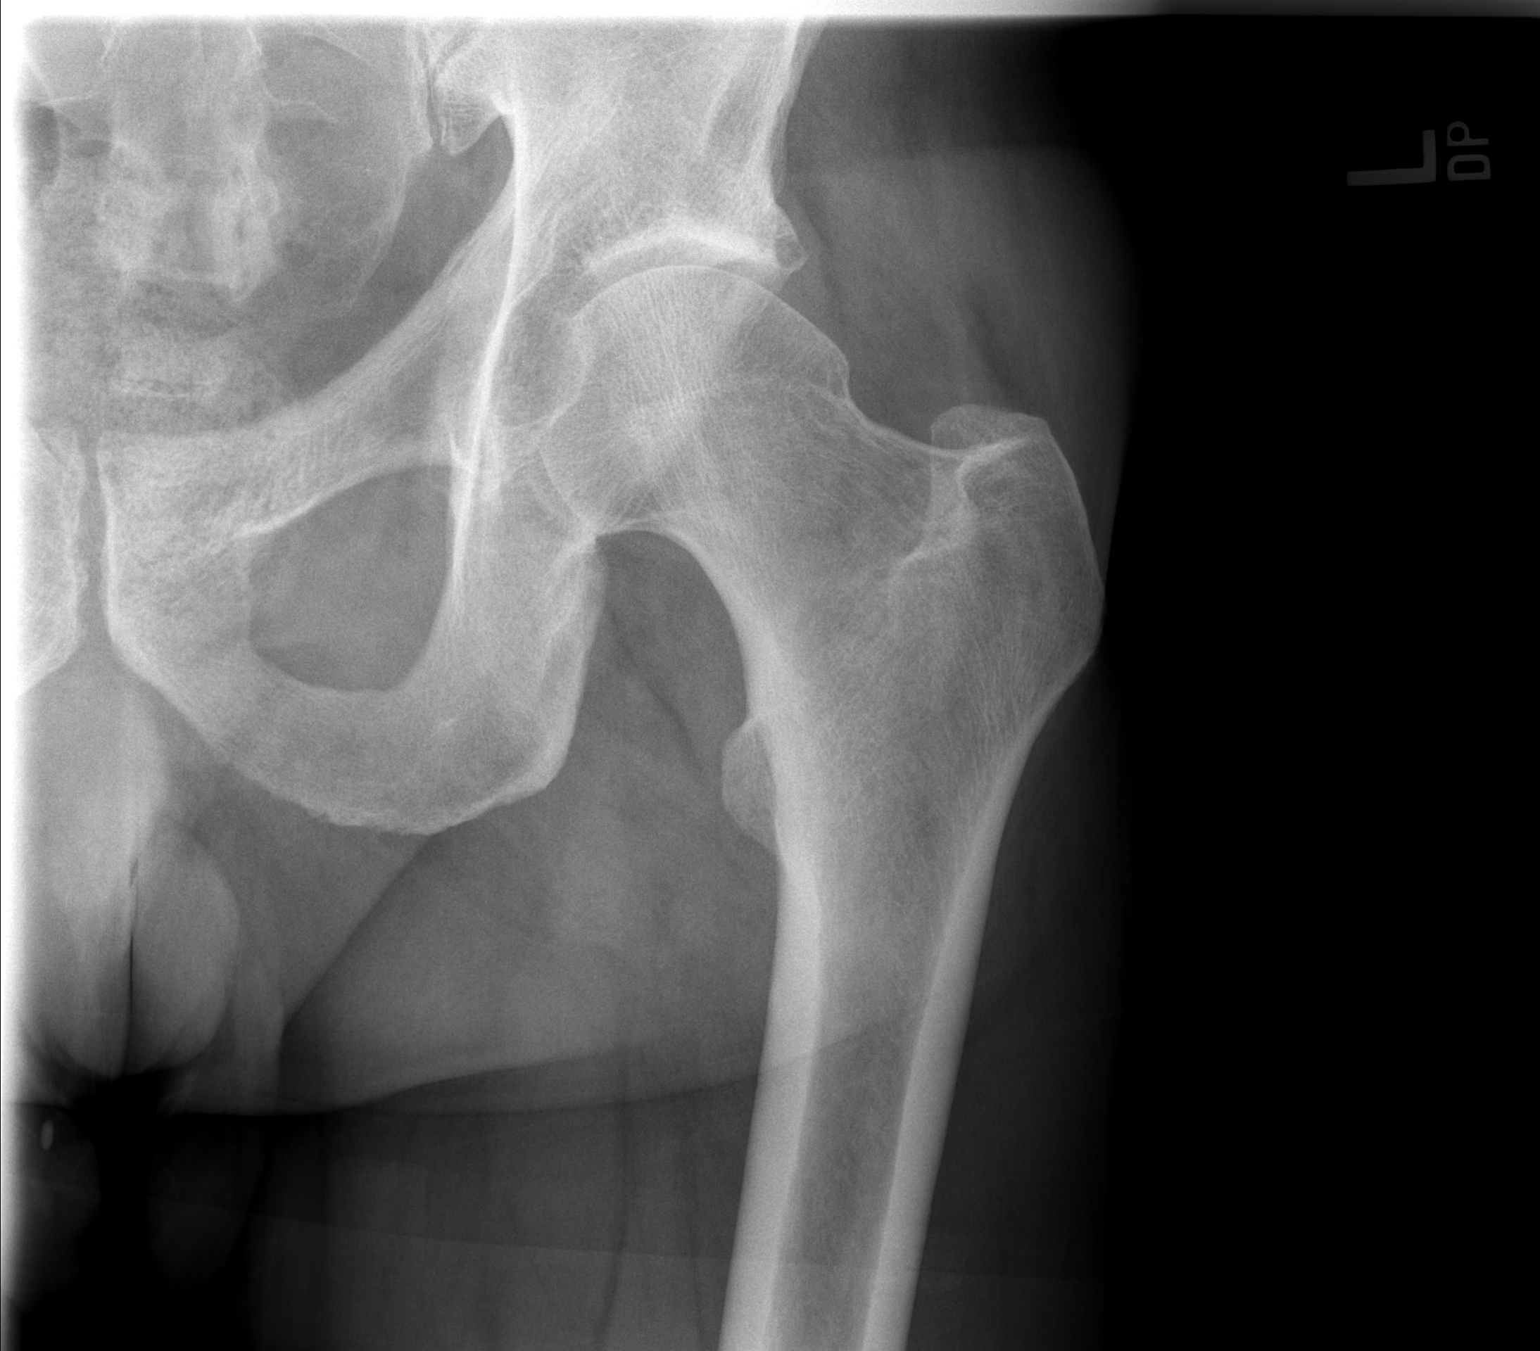

[t hip frog leg left]
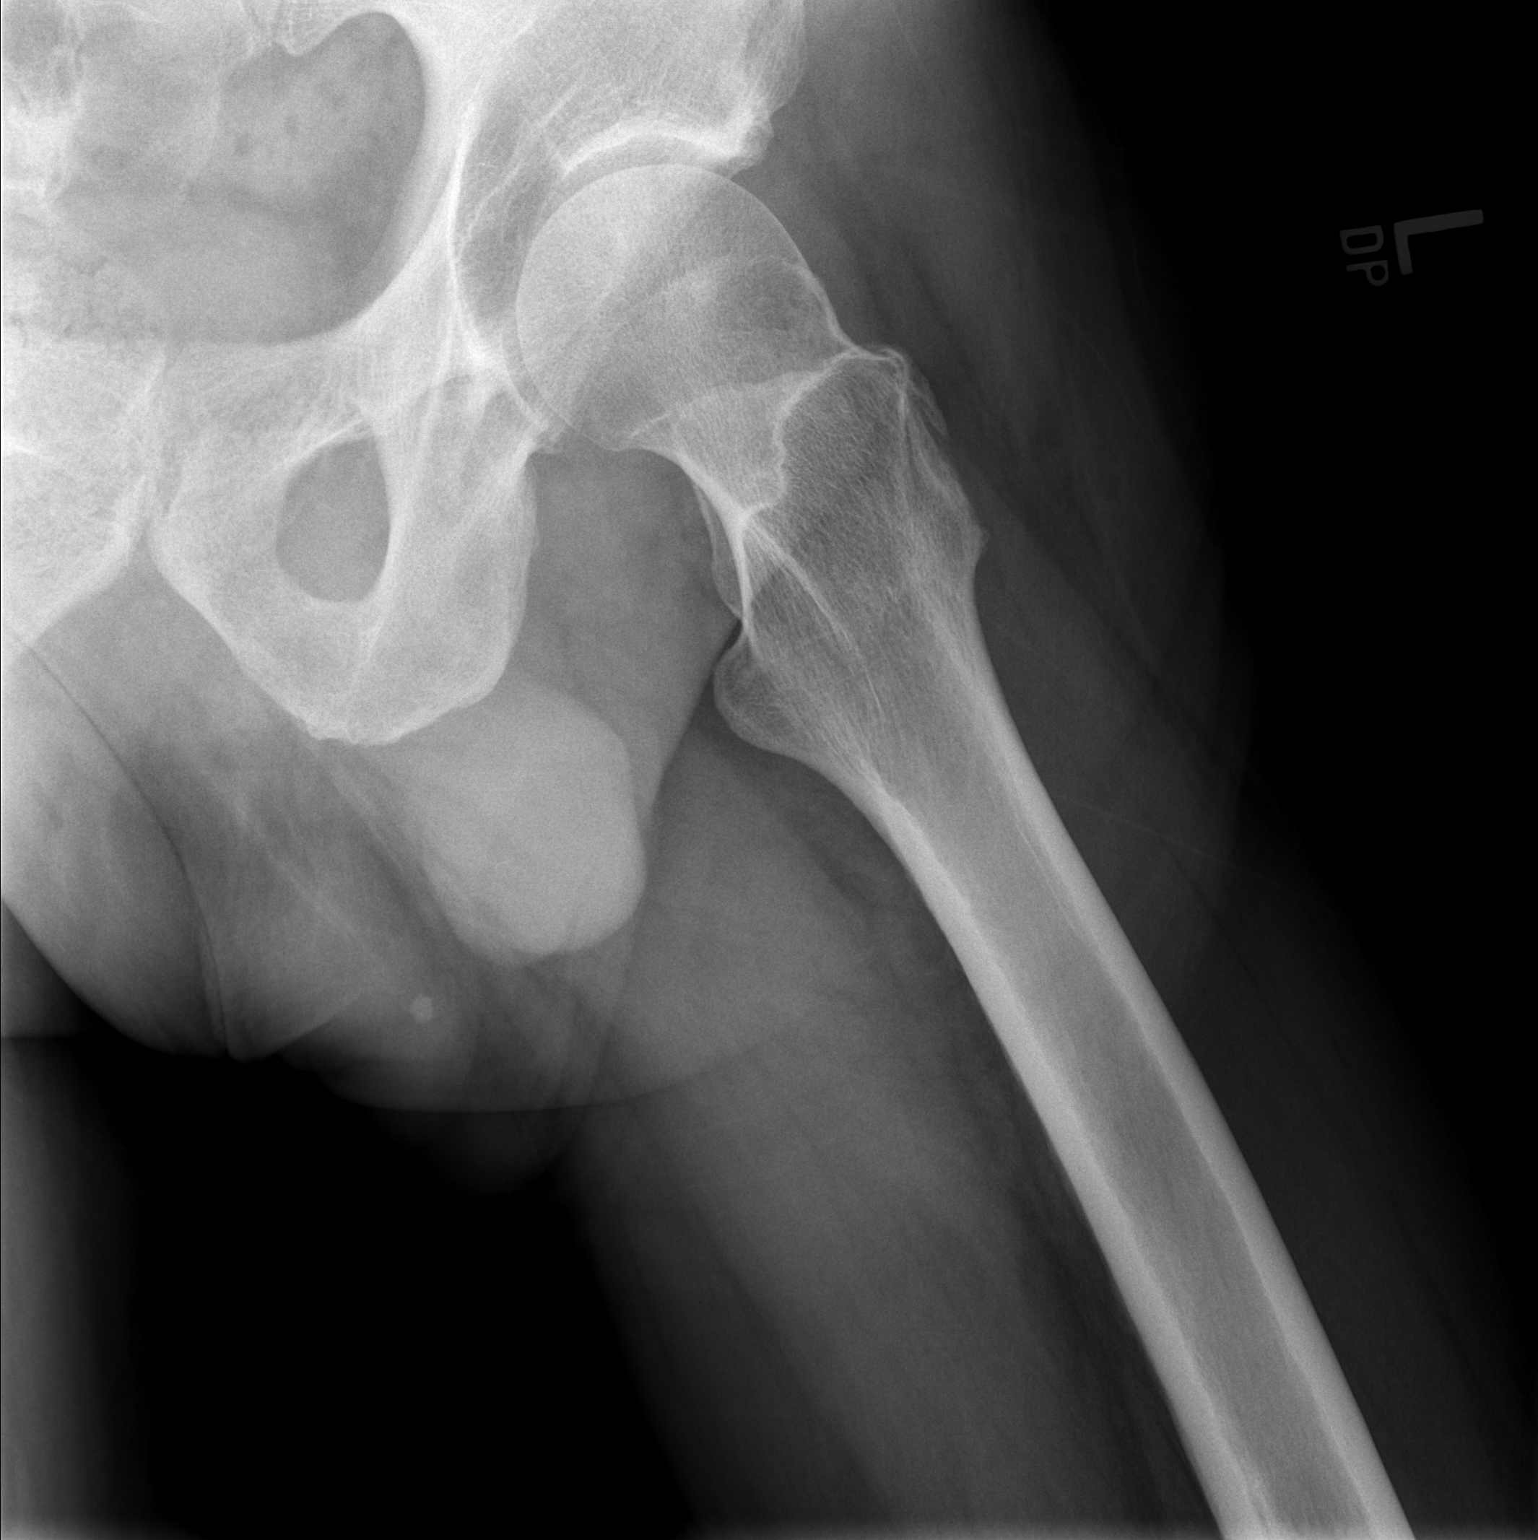

[2 of 2 positions shown; findings below may reference images not displayed]

FINDINGS: Mild superomedial left femoroacetabular joint space narrowing.
Minimal anterior superior left femoral head-neck junction CAM-type
bump deformity. Small degenerative cysts within the superolateral
aspect of the left acetabulum.

The pubic symphysis joint spaces maintained.

No acute fracture or dislocation.
IMPRESSION: Mild left femoroacetabular osteoarthritis. Mild left femoral
head-neck junction CAM-type bump deformity increases propensity for
CAM-type femoroacetabular impingement.

## 2023-09-22 DIAGNOSIS — I491 Atrial premature depolarization: Secondary | ICD-10-CM | POA: Diagnosis not present

## 2023-09-22 DIAGNOSIS — R7301 Impaired fasting glucose: Secondary | ICD-10-CM | POA: Diagnosis not present

## 2023-09-22 DIAGNOSIS — Z79899 Other long term (current) drug therapy: Secondary | ICD-10-CM | POA: Diagnosis not present

## 2023-09-22 DIAGNOSIS — Z0001 Encounter for general adult medical examination with abnormal findings: Secondary | ICD-10-CM | POA: Diagnosis not present

## 2023-09-22 DIAGNOSIS — K219 Gastro-esophageal reflux disease without esophagitis: Secondary | ICD-10-CM | POA: Diagnosis not present

## 2023-11-20 DIAGNOSIS — J019 Acute sinusitis, unspecified: Secondary | ICD-10-CM | POA: Diagnosis not present

## 2023-12-14 DIAGNOSIS — Z85828 Personal history of other malignant neoplasm of skin: Secondary | ICD-10-CM | POA: Diagnosis not present

## 2023-12-14 DIAGNOSIS — D485 Neoplasm of uncertain behavior of skin: Secondary | ICD-10-CM | POA: Diagnosis not present

## 2023-12-14 DIAGNOSIS — L821 Other seborrheic keratosis: Secondary | ICD-10-CM | POA: Diagnosis not present

## 2023-12-14 DIAGNOSIS — L814 Other melanin hyperpigmentation: Secondary | ICD-10-CM | POA: Diagnosis not present

## 2023-12-14 DIAGNOSIS — L570 Actinic keratosis: Secondary | ICD-10-CM | POA: Diagnosis not present

## 2023-12-14 DIAGNOSIS — L820 Inflamed seborrheic keratosis: Secondary | ICD-10-CM | POA: Diagnosis not present
